# Patient Record
Sex: Female | Born: 1965 | Race: White | Marital: Married
Health system: Southern US, Community
[De-identification: ages and names within clinical notes are randomized; demographics above are authoritative.]

---

## 2018-08-24 ENCOUNTER — Other Ambulatory Visit: Payer: Self-pay | Admitting: Physician Assistant

## 2018-08-24 DIAGNOSIS — Z1231 Encounter for screening mammogram for malignant neoplasm of breast: Secondary | ICD-10-CM

## 2018-08-26 ENCOUNTER — Other Ambulatory Visit: Payer: Self-pay | Admitting: Family

## 2018-10-14 ENCOUNTER — Other Ambulatory Visit: Payer: Self-pay

## 2018-10-14 ENCOUNTER — Ambulatory Visit
Admission: RE | Admit: 2018-10-14 | Discharge: 2018-10-14 | Disposition: A | Discharge status: Home / Self Care | Payer: BC Managed Care – PPO | Source: Ambulatory Visit | Attending: Physician Assistant | Admitting: Physician Assistant

## 2018-10-14 DIAGNOSIS — Z1231 Encounter for screening mammogram for malignant neoplasm of breast: Secondary | ICD-10-CM | POA: Diagnosis not present

## 2018-10-21 ENCOUNTER — Other Ambulatory Visit: Payer: Self-pay | Admitting: Physician Assistant

## 2018-10-21 DIAGNOSIS — N6310 Unspecified lump in the right breast, unspecified quadrant: Secondary | ICD-10-CM

## 2018-10-21 DIAGNOSIS — R928 Other abnormal and inconclusive findings on diagnostic imaging of breast: Secondary | ICD-10-CM

## 2018-10-22 ENCOUNTER — Ambulatory Visit
Admission: RE | Admit: 2018-10-22 | Discharge: 2018-10-22 | Disposition: A | Discharge status: Home / Self Care | Payer: BC Managed Care – PPO | Source: Ambulatory Visit | Attending: Physician Assistant | Admitting: Physician Assistant

## 2018-10-22 ENCOUNTER — Other Ambulatory Visit: Payer: Self-pay

## 2018-10-22 DIAGNOSIS — R928 Other abnormal and inconclusive findings on diagnostic imaging of breast: Secondary | ICD-10-CM

## 2018-10-22 DIAGNOSIS — N6310 Unspecified lump in the right breast, unspecified quadrant: Secondary | ICD-10-CM

## 2019-08-31 ENCOUNTER — Other Ambulatory Visit: Payer: Self-pay | Admitting: Physician Assistant

## 2019-08-31 DIAGNOSIS — Z1231 Encounter for screening mammogram for malignant neoplasm of breast: Secondary | ICD-10-CM

## 2019-10-18 ENCOUNTER — Ambulatory Visit
Admission: RE | Admit: 2019-10-18 | Discharge: 2019-10-18 | Disposition: A | Discharge status: Home / Self Care | Payer: BC Managed Care – PPO | Source: Ambulatory Visit | Attending: Physician Assistant | Admitting: Physician Assistant

## 2019-10-18 DIAGNOSIS — Z1231 Encounter for screening mammogram for malignant neoplasm of breast: Secondary | ICD-10-CM | POA: Diagnosis present

## 2019-10-25 ENCOUNTER — Other Ambulatory Visit: Payer: Self-pay | Admitting: Physician Assistant

## 2019-10-25 DIAGNOSIS — N6489 Other specified disorders of breast: Secondary | ICD-10-CM

## 2019-10-25 DIAGNOSIS — R928 Other abnormal and inconclusive findings on diagnostic imaging of breast: Secondary | ICD-10-CM

## 2019-11-02 ENCOUNTER — Ambulatory Visit
Admission: RE | Admit: 2019-11-02 | Discharge: 2019-11-02 | Disposition: A | Discharge status: Home / Self Care | Payer: BC Managed Care – PPO | Source: Ambulatory Visit | Attending: Physician Assistant | Admitting: Physician Assistant

## 2019-11-02 DIAGNOSIS — N6489 Other specified disorders of breast: Secondary | ICD-10-CM | POA: Diagnosis present

## 2019-11-02 DIAGNOSIS — R928 Other abnormal and inconclusive findings on diagnostic imaging of breast: Secondary | ICD-10-CM | POA: Diagnosis not present

## 2019-11-03 ENCOUNTER — Other Ambulatory Visit: Payer: Self-pay | Admitting: Physician Assistant

## 2019-11-03 DIAGNOSIS — N6310 Unspecified lump in the right breast, unspecified quadrant: Secondary | ICD-10-CM

## 2020-05-15 ENCOUNTER — Ambulatory Visit
Admission: RE | Admit: 2020-05-15 | Discharge: 2020-05-15 | Disposition: A | Discharge status: Home / Self Care | Payer: BC Managed Care – PPO | Source: Ambulatory Visit | Attending: Physician Assistant | Admitting: Physician Assistant

## 2020-05-15 ENCOUNTER — Other Ambulatory Visit: Payer: Self-pay

## 2020-05-15 DIAGNOSIS — N6310 Unspecified lump in the right breast, unspecified quadrant: Secondary | ICD-10-CM | POA: Diagnosis not present

## 2020-05-17 ENCOUNTER — Other Ambulatory Visit: Payer: Self-pay | Admitting: Physician Assistant

## 2020-05-17 DIAGNOSIS — R928 Other abnormal and inconclusive findings on diagnostic imaging of breast: Secondary | ICD-10-CM

## 2020-05-17 DIAGNOSIS — N63 Unspecified lump in unspecified breast: Secondary | ICD-10-CM

## 2020-10-18 ENCOUNTER — Ambulatory Visit
Admission: RE | Admit: 2020-10-18 | Discharge: 2020-10-18 | Disposition: A | Discharge status: Home / Self Care | Payer: BC Managed Care – PPO | Source: Ambulatory Visit | Attending: Physician Assistant | Admitting: Physician Assistant

## 2020-10-18 ENCOUNTER — Other Ambulatory Visit: Payer: Self-pay

## 2020-10-18 ENCOUNTER — Other Ambulatory Visit: Payer: BC Managed Care – PPO

## 2020-10-18 DIAGNOSIS — R928 Other abnormal and inconclusive findings on diagnostic imaging of breast: Secondary | ICD-10-CM

## 2020-10-18 DIAGNOSIS — N63 Unspecified lump in unspecified breast: Secondary | ICD-10-CM

## 2021-05-05 ENCOUNTER — Encounter: Payer: Self-pay | Admitting: Emergency Medicine

## 2021-05-05 ENCOUNTER — Emergency Department: Payer: BC Managed Care – PPO

## 2021-05-05 ENCOUNTER — Emergency Department
Admission: EM | Admit: 2021-05-05 | Discharge: 2021-05-05 | Disposition: A | Discharge status: Home / Self Care | Payer: BC Managed Care – PPO | Attending: Emergency Medicine | Admitting: Emergency Medicine

## 2021-05-05 ENCOUNTER — Other Ambulatory Visit: Payer: Self-pay

## 2021-05-05 DIAGNOSIS — M79605 Pain in left leg: Secondary | ICD-10-CM | POA: Diagnosis not present

## 2021-05-05 MED ORDER — MELOXICAM 15 MG PO TABS: 15.0000 mg | ORAL_TABLET | Freq: Every day | ORAL | 2 refills | Status: AC

## 2021-05-05 NOTE — ED Provider Notes (Signed)
[facility] Provider Note    Event Date/Time   First MD Initiated Contact with Patient 05/05/21 0932     (approximate)   History   Leg Pain   HPI  Curtis Victor-Herring is a 56 y.o. female presents emergency department complaining of left leg pain.  Patient states that this week she had pain shooting down from the hip to the leg and her physician thought was sciatica so started her on a prednisone taper.  Today she woke up with swelling in the lower leg and behind the knee.  Unable to bear weight due to pain.  States she was walking with a limp.  No chest pain or shortness of breath.  No fever or chills no known injury      Physical Exam   Triage Vital Signs: ED Triage Vitals  Enc Vitals Group     BP 05/05/21 0832 (!) 148/90     Pulse Rate 05/05/21 0832 72     Resp 05/05/21 0832 16     Temp 05/05/21 0832 98.2 F (36.8 C)     Temp Source 05/05/21 0832 Oral     SpO2 05/05/21 0832 98 %     Weight 05/05/21 0825 181 lb (82.1 kg)     Height 05/05/21 0825 5\' 5"  (1.651 m)     Head Circumference --      Peak Flow --      Pain Score 05/05/21 0825 6     Pain Loc --      Pain Edu? --      Excl. in GC? --     Most recent vital signs: Vitals:   05/05/21 0832  BP: (!) 148/90  Pulse: 72  Resp: 16  Temp: 98.2 F (36.8 C)  SpO2: 98%     General: Awake, no distress.   CV:  Good peripheral perfusion. regular rate and  rhythm Resp:  Normal effort.  Abd:  No distention.   Other:  Left lower leg has swelling and tenderness at the posterior calf up through the knee, swelling noted at the left knee, neurovascular is intact, patient walks with a limp   ED Results / Procedures / Treatments   Labs (all labs ordered are listed, but only abnormal results are displayed) Labs Reviewed - No data to display   EKG     RADIOLOGY Ultrasound left lower extremity    PROCEDURES:   Procedures   MEDICATIONS ORDERED IN ED: Medications - No data to  display   IMPRESSION / MDM / ASSESSMENT AND PLAN / ED COURSE  I reviewed the triage vital signs and the nursing notes.                              Differential diagnosis includes, but is not limited to, DVT, Baker's cyst, cellulitis  Ultrasound left lower extremity for DVT  I did review the ultrasound.  I do not see a DVT but I am awaiting radiology read Radiology read as negative  I do think the patient has more of a Baker's cyst due to where the swelling is located posteriorly.  Place the patient in a hinged knee brace.  She is to take meloxicam daily.  Apply ice.  Return emergency department if worsening.  I did explain to her that a negative DVT ultrasound today may not mean that it is completely negative.  If she continues to have pain and swelling she should  have a repeat ultrasound for DVT and 1 week.  Patient has a short flight to [place] this week.  Explained that she could go ahead and start taking baby aspirin this may help prevent any clotting.  Patient is in agreement treatment plan.  She is to follow-up with orthopedics.  Discharged in stable condition.      FINAL CLINICAL IMPRESSION(S) / ED DIAGNOSES   Final diagnoses:  Left leg pain     Rx / DC Orders   ED Discharge Orders          Ordered    meloxicam (MOBIC) 15 MG tablet  Daily        05/05/21 1127             Note:  This document was prepared using Dragon voice recognition software and may include unintentional dictation errors.    Faythe Ghee, PA-C 05/05/21 1132    Gilles Chiquito, MD 05/05/21 1550

## 2021-05-05 NOTE — ED Notes (Signed)
Patient taken to ultrasound.

## 2021-05-05 NOTE — ED Triage Notes (Signed)
Pt to ED via POV c/o left leg pain. Pt states that she did a virtual visit this morning and they recommended that she have an ultrasound of her left leg. Pt walking with limp. Pt is in NAD. Pt states that she has swelling behind her left knee. Pt states that she is having mild pain.

## 2021-05-05 NOTE — Discharge Instructions (Signed)
Use an ace wrap to help compress the swelling Apply ice to the posterior knee Elevate as much as possible Return if worsening, you may need a repeat US in 1 week if you continue to have pain and swelling in the left leg Tylenol and ibuprofen for pain as needed Finish the prednisone

## 2021-05-05 NOTE — ED Notes (Signed)
Pt states that she is currently on a prednisone taper

## 2021-08-20 IMAGING — MG DIGITAL DIAGNOSTIC BILAT W/ TOMO W/ CAD
6 of 10 series · 6 of 30 positions shown · non-contrast
Comparison: Previous exam(s).

CLINICAL DATA: Short-term follow-up for likely benign right breast
masses.

EXAM:
DIGITAL DIAGNOSTIC BILATERAL MAMMOGRAM WITH TOMOSYNTHESIS AND CAD;
ULTRASOUND RIGHT BREAST LIMITED
TECHNIQUE: Bilateral digital diagnostic mammography and breast tomosynthesis
was performed. The images were evaluated with computer-aided
detection.; Targeted ultrasound examination of the right breast was
performed

[R CC synth-2D]
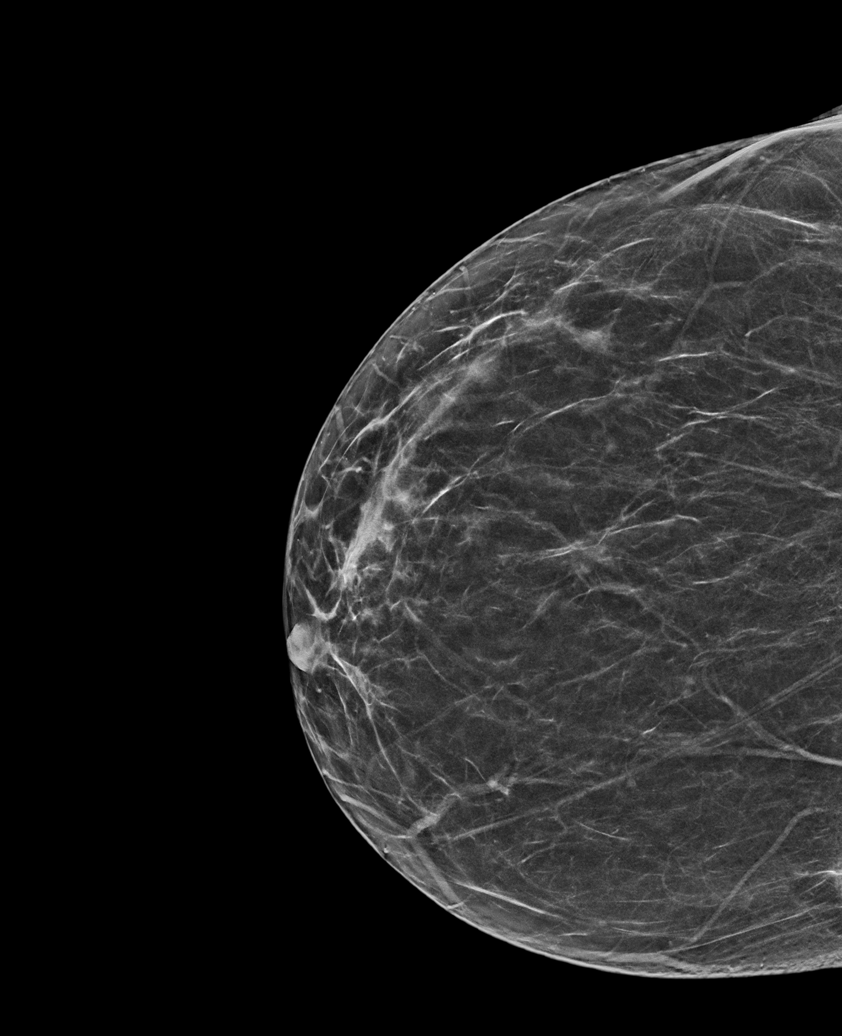

[L CC synth-2D]
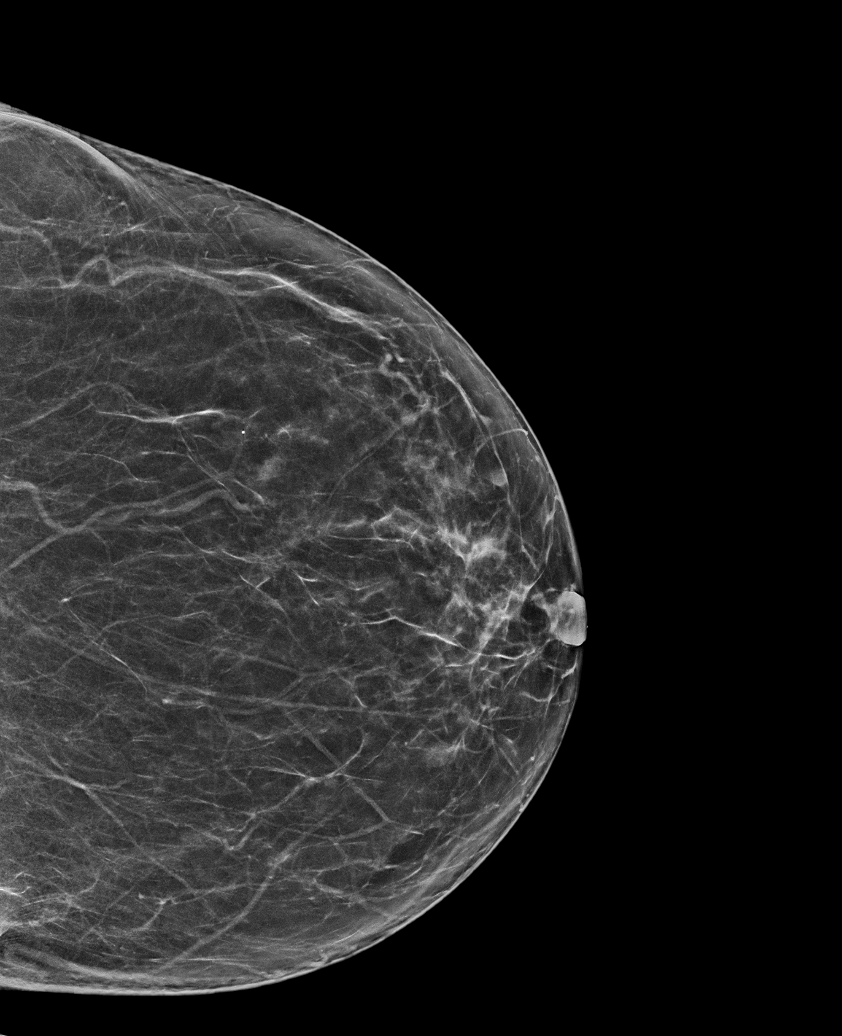

[L MLO synth-2D (1 of 2)]
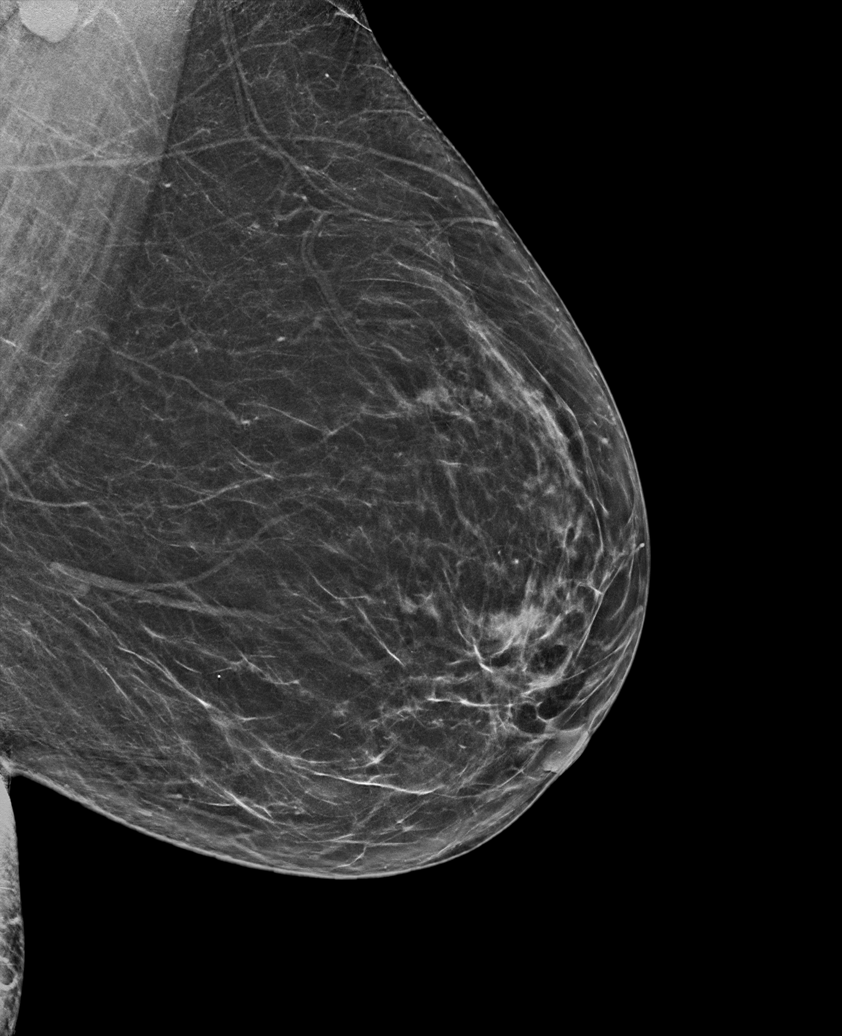

[R MLO synth-2D]
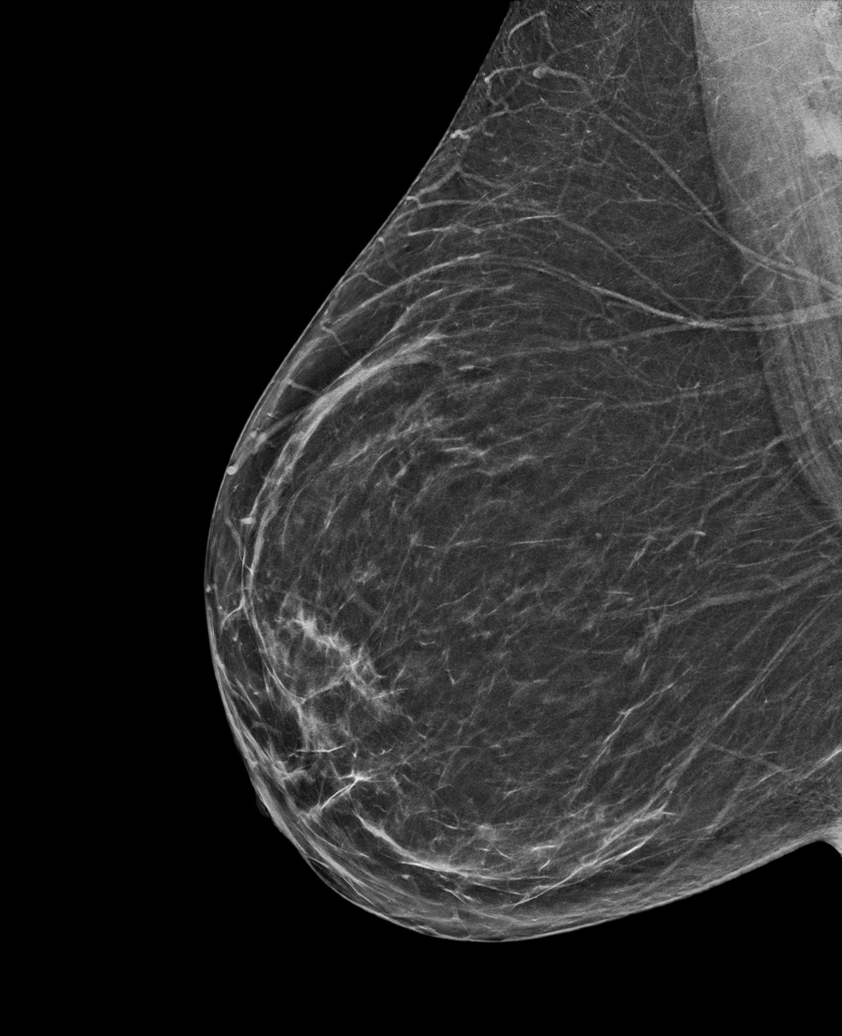

[L MLO synth-2D (2 of 2)]
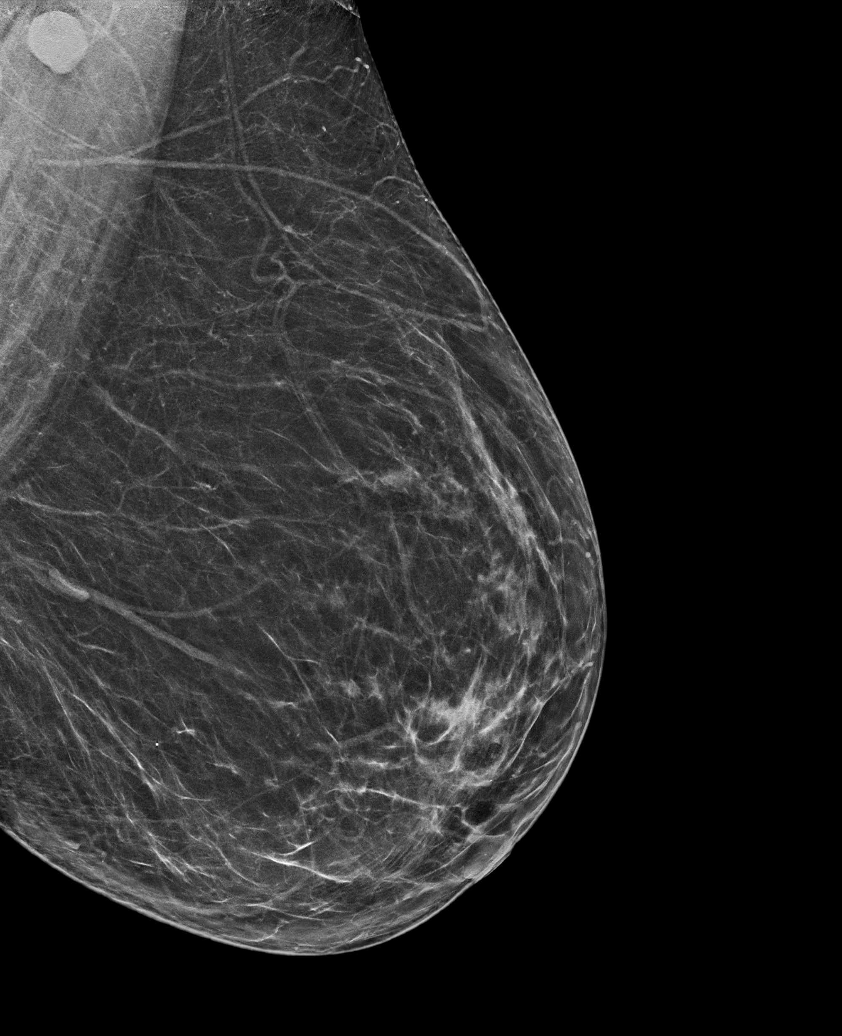

[L MLO tomo · tomo slice 36/71.0]
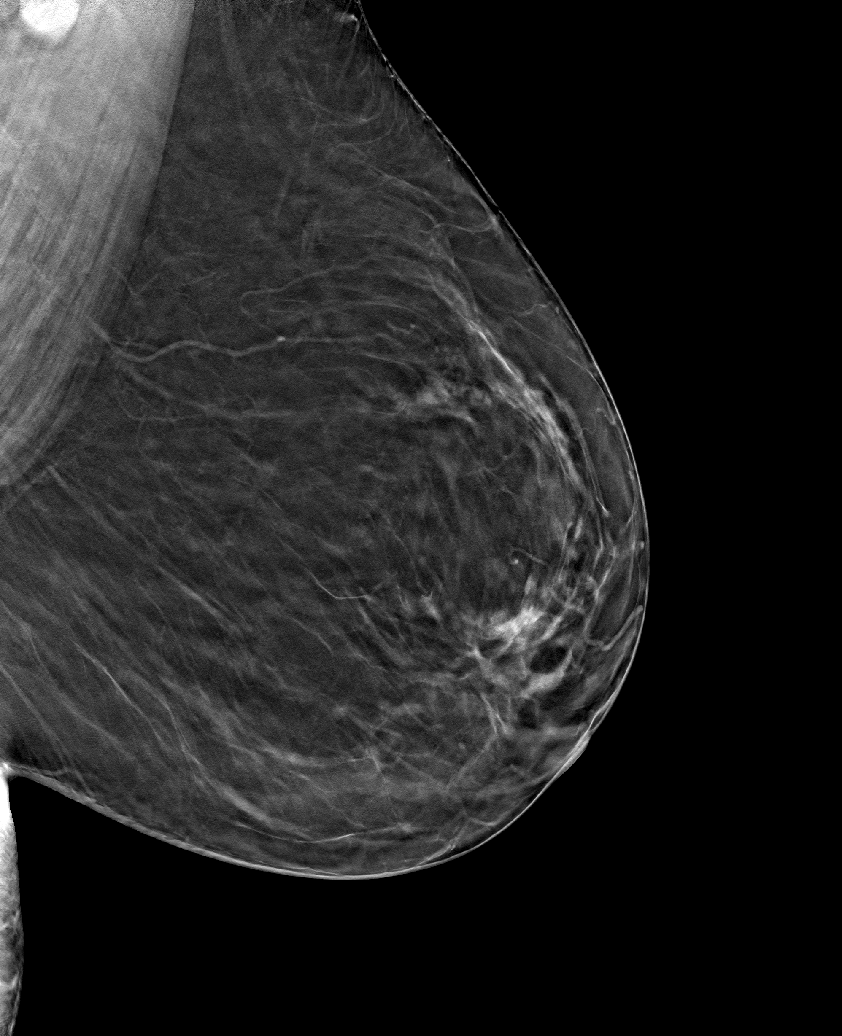

[6 of 30 positions shown; findings below may reference images not displayed]

ACR Breast Density Category b: There are scattered areas of
fibroglandular density.
FINDINGS: The 2 masses previously seen in the upper-outer quadrant of the
right breast are not as clearly visualized on today's mammographic
exam. No suspicious calcifications, masses or areas of distortion
are seen in the bilateral breasts.

Ultrasound targeted to the right breast at 11 o'clock, 8 cm from the
nipple demonstrates a stable oval hypoechoic mass measuring 5 x 2 x
4 mm, previously measuring 5 x 2 x 4 mm.

The mass at 9 o'clock, 6 cm from the nipple demonstrates a stable
round near anechoic mass measuring 3 x 3 x 3 mm, previously
measuring 3 x 3 x 3 mm.
IMPRESSION: 1.  The 2 likely benign left breast masses are stable.

2.  No mammographic evidence of malignancy in the bilateral breasts.

RECOMMENDATION:
Bilateral diagnostic mammogram and left breast ultrasound in 1 year.

I have discussed the findings and recommendations with the patient.
If applicable, a reminder letter will be sent to the patient
regarding the next appointment.

BI-RADS CATEGORY  3: Probably benign.

## 2021-09-24 ENCOUNTER — Other Ambulatory Visit: Payer: Self-pay | Admitting: Physician Assistant

## 2021-09-24 DIAGNOSIS — N63 Unspecified lump in unspecified breast: Secondary | ICD-10-CM

## 2021-10-22 ENCOUNTER — Ambulatory Visit
Admission: RE | Admit: 2021-10-22 | Discharge: 2021-10-22 | Disposition: A | Discharge status: Home / Self Care | Payer: BC Managed Care – PPO | Source: Ambulatory Visit | Attending: Physician Assistant | Admitting: Physician Assistant

## 2021-10-22 DIAGNOSIS — N63 Unspecified lump in unspecified breast: Secondary | ICD-10-CM

## 2022-10-15 ENCOUNTER — Other Ambulatory Visit: Payer: Self-pay | Admitting: Physician Assistant

## 2022-10-15 DIAGNOSIS — Z1231 Encounter for screening mammogram for malignant neoplasm of breast: Secondary | ICD-10-CM

## 2022-10-29 ENCOUNTER — Encounter: Payer: Self-pay | Admitting: Radiology

## 2022-10-29 ENCOUNTER — Ambulatory Visit
Admission: RE | Admit: 2022-10-29 | Discharge: 2022-10-29 | Disposition: A | Discharge status: Home / Self Care | Payer: BC Managed Care – PPO | Source: Ambulatory Visit | Attending: Physician Assistant | Admitting: Physician Assistant

## 2022-10-29 DIAGNOSIS — Z1231 Encounter for screening mammogram for malignant neoplasm of breast: Secondary | ICD-10-CM | POA: Insufficient documentation

## 2023-03-17 ENCOUNTER — Ambulatory Visit: Payer: BC Managed Care – PPO

## 2023-03-17 DIAGNOSIS — K640 First degree hemorrhoids: Secondary | ICD-10-CM | POA: Diagnosis not present

## 2023-03-17 DIAGNOSIS — Z860100 Personal history of colon polyps, unspecified: Secondary | ICD-10-CM | POA: Diagnosis not present

## 2023-03-17 DIAGNOSIS — Z09 Encounter for follow-up examination after completed treatment for conditions other than malignant neoplasm: Secondary | ICD-10-CM | POA: Diagnosis present

## 2023-03-17 DIAGNOSIS — K5730 Diverticulosis of large intestine without perforation or abscess without bleeding: Secondary | ICD-10-CM | POA: Diagnosis not present

## 2023-09-16 ENCOUNTER — Other Ambulatory Visit: Payer: Self-pay | Admitting: Physician Assistant

## 2023-09-16 DIAGNOSIS — Z1231 Encounter for screening mammogram for malignant neoplasm of breast: Secondary | ICD-10-CM

## 2023-10-30 ENCOUNTER — Ambulatory Visit
Admission: RE | Admit: 2023-10-30 | Discharge: 2023-10-30 | Disposition: A | Discharge status: Home / Self Care | Source: Ambulatory Visit | Attending: Physician Assistant | Admitting: Physician Assistant

## 2023-10-30 DIAGNOSIS — Z1231 Encounter for screening mammogram for malignant neoplasm of breast: Secondary | ICD-10-CM | POA: Insufficient documentation

## 2023-11-05 ENCOUNTER — Other Ambulatory Visit: Payer: Self-pay | Admitting: Physician Assistant

## 2023-11-05 DIAGNOSIS — R928 Other abnormal and inconclusive findings on diagnostic imaging of breast: Secondary | ICD-10-CM

## 2023-11-07 ENCOUNTER — Ambulatory Visit
Admission: RE | Admit: 2023-11-07 | Discharge: 2023-11-07 | Disposition: A | Discharge status: Home / Self Care | Source: Ambulatory Visit | Attending: Physician Assistant | Admitting: Physician Assistant

## 2023-11-07 DIAGNOSIS — R928 Other abnormal and inconclusive findings on diagnostic imaging of breast: Secondary | ICD-10-CM | POA: Insufficient documentation
